# Patient Record
Sex: Male | Born: 2000 | Race: White | Hispanic: No | Marital: Single | Smoking: Never smoker
Health system: Southern US, Community
[De-identification: ages and names within clinical notes are randomized; demographics above are authoritative.]

---

## 2018-12-07 ENCOUNTER — Emergency Department
Admission: EM | Admit: 2018-12-07 | Discharge: 2018-12-07 | Disposition: A | Discharge status: Home / Self Care | Payer: 59 | Attending: Emergency Medicine | Admitting: Emergency Medicine

## 2018-12-07 ENCOUNTER — Other Ambulatory Visit: Payer: Self-pay

## 2018-12-07 ENCOUNTER — Encounter: Payer: Self-pay | Admitting: Emergency Medicine

## 2018-12-07 ENCOUNTER — Emergency Department: Payer: 59

## 2018-12-07 DIAGNOSIS — S13100A Subluxation of unspecified cervical vertebrae, initial encounter: Secondary | ICD-10-CM | POA: Diagnosis not present

## 2018-12-07 DIAGNOSIS — M4306 Spondylolysis, lumbar region: Secondary | ICD-10-CM | POA: Insufficient documentation

## 2018-12-07 DIAGNOSIS — Y93I9 Activity, other involving external motion: Secondary | ICD-10-CM | POA: Insufficient documentation

## 2018-12-07 DIAGNOSIS — M545 Low back pain, unspecified: Secondary | ICD-10-CM

## 2018-12-07 DIAGNOSIS — S3992XA Unspecified injury of lower back, initial encounter: Secondary | ICD-10-CM | POA: Insufficient documentation

## 2018-12-07 DIAGNOSIS — Y92410 Unspecified street and highway as the place of occurrence of the external cause: Secondary | ICD-10-CM | POA: Diagnosis not present

## 2018-12-07 DIAGNOSIS — Y998 Other external cause status: Secondary | ICD-10-CM | POA: Diagnosis not present

## 2018-12-07 MED ORDER — NAPROXEN 500 MG PO TABS: 500.0000 mg | ORAL_TABLET | Freq: Two times a day (BID) | ORAL | 0 refills | Status: AC

## 2018-12-07 MED ORDER — CYCLOBENZAPRINE HCL 10 MG PO TABS: 10.0000 mg | ORAL_TABLET | Freq: Three times a day (TID) | ORAL | 0 refills | Status: AC | PRN
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
## 2018-12-07 NOTE — Discharge Instructions (Signed)
Please follow up with the primary care provider of your choice for symptoms that are not improving over the week. ° °Return to the ER for symptoms that change or worsen if unable to schedule an appointment. °
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
